# Patient Record
Sex: Female | Born: 1965 | Race: Black or African American | Hispanic: No | Marital: Single
Health system: Southern US, Community
[De-identification: ages and names within clinical notes are randomized; demographics above are authoritative.]

## PROBLEM LIST (undated history)

## (undated) DIAGNOSIS — I10 Essential (primary) hypertension: Secondary | ICD-10-CM

---

## 2001-10-06 ENCOUNTER — Emergency Department (HOSPITAL_COMMUNITY): Admission: EM | Admit: 2001-10-06 | Discharge: 2001-10-06 | Payer: Self-pay | Admitting: Emergency Medicine

## 2001-10-06 ENCOUNTER — Encounter: Payer: Self-pay | Admitting: Emergency Medicine

## 2001-11-10 ENCOUNTER — Encounter: Admission: RE | Admit: 2001-11-10 | Discharge: 2001-11-10 | Payer: Self-pay | Admitting: Internal Medicine

## 2001-11-10 ENCOUNTER — Encounter: Payer: Self-pay | Admitting: Internal Medicine

## 2001-12-12 ENCOUNTER — Emergency Department (HOSPITAL_COMMUNITY): Admission: EM | Admit: 2001-12-12 | Discharge: 2001-12-12 | Payer: Self-pay | Admitting: Emergency Medicine

## 2007-02-27 ENCOUNTER — Emergency Department (HOSPITAL_COMMUNITY): Admission: EM | Admit: 2007-02-27 | Discharge: 2007-02-27 | Payer: Self-pay | Admitting: Emergency Medicine

## 2007-05-01 ENCOUNTER — Ambulatory Visit: Payer: Self-pay | Admitting: Internal Medicine

## 2007-05-03 ENCOUNTER — Ambulatory Visit: Payer: Self-pay | Admitting: *Deleted

## 2007-05-08 ENCOUNTER — Encounter: Payer: Self-pay | Admitting: Family Medicine

## 2007-05-08 ENCOUNTER — Ambulatory Visit: Payer: Self-pay | Admitting: Internal Medicine

## 2007-05-08 LAB — CONVERTED CEMR LAB
Chlamydia, DNA Probe: NEGATIVE
GC Probe Amp, Genital: NEGATIVE

## 2007-05-10 ENCOUNTER — Ambulatory Visit: Payer: Self-pay | Admitting: Internal Medicine

## 2007-05-12 ENCOUNTER — Ambulatory Visit (HOSPITAL_COMMUNITY): Admission: RE | Admit: 2007-05-12 | Discharge: 2007-05-12 | Payer: Self-pay | Admitting: Family Medicine

## 2007-05-24 ENCOUNTER — Ambulatory Visit: Payer: Self-pay | Admitting: Internal Medicine

## 2007-06-14 ENCOUNTER — Encounter (INDEPENDENT_AMBULATORY_CARE_PROVIDER_SITE_OTHER): Payer: Self-pay | Admitting: Family Medicine

## 2007-06-14 ENCOUNTER — Ambulatory Visit: Payer: Self-pay | Admitting: Internal Medicine

## 2007-06-14 LAB — CONVERTED CEMR LAB
ALT: 9 units/L (ref 0–35)
AST: 13 units/L (ref 0–37)
Albumin: 4.1 g/dL (ref 3.5–5.2)
Alkaline Phosphatase: 56 units/L (ref 39–117)
BUN: 10 mg/dL (ref 6–23)
Basophils Absolute: 0 10*3/uL (ref 0.0–0.1)
Basophils Relative: 0 % (ref 0–1)
CO2: 25 meq/L (ref 19–32)
Calcium: 9.2 mg/dL (ref 8.4–10.5)
Chloride: 104 meq/L (ref 96–112)
Cholesterol: 184 mg/dL (ref 0–200)
Creatinine, Ser: 0.96 mg/dL (ref 0.40–1.20)
Eosinophils Absolute: 0.1 10*3/uL (ref 0.0–0.7)
Eosinophils Relative: 1 % (ref 0–5)
Glucose, Bld: 87 mg/dL (ref 70–99)
HCT: 42.4 % (ref 36.0–46.0)
HDL: 39 mg/dL — ABNORMAL LOW (ref >39)
Hemoglobin: 14 g/dL (ref 12.0–15.0)
LDL Cholesterol: 130 mg/dL — ABNORMAL HIGH (ref 0–99)
Lymphocytes Relative: 36 % (ref 12–46)
Lymphs Abs: 2 10*3/uL (ref 0.7–3.3)
MCHC: 33 g/dL (ref 30.0–36.0)
MCV: 94.4 fL (ref 78.0–100.0)
Monocytes Absolute: 0.4 10*3/uL (ref 0.2–0.7)
Monocytes Relative: 7 % (ref 3–11)
Neutro Abs: 3.1 10*3/uL (ref 1.7–7.7)
Neutrophils Relative %: 56 % (ref 43–77)
Platelets: 278 10*3/uL (ref 150–400)
Potassium: 4 meq/L (ref 3.5–5.3)
RBC: 4.49 M/uL (ref 3.87–5.11)
RDW: 14 % (ref 11.5–14.0)
Sodium: 139 meq/L (ref 135–145)
Total Bilirubin: 0.7 mg/dL (ref 0.3–1.2)
Total CHOL/HDL Ratio: 4.7
Total Protein: 7.3 g/dL (ref 6.0–8.3)
Triglycerides: 77 mg/dL (ref <150)
VLDL: 15 mg/dL (ref 0–40)
WBC: 5.5 10*3/uL (ref 4.0–10.5)

## 2020-01-20 ENCOUNTER — Emergency Department (HOSPITAL_COMMUNITY): Payer: No Typology Code available for payment source

## 2020-01-20 ENCOUNTER — Emergency Department (HOSPITAL_COMMUNITY)
Admission: EM | Admit: 2020-01-20 | Discharge: 2020-01-20 | Disposition: A | Discharge status: Home / Self Care | Payer: No Typology Code available for payment source | Attending: Emergency Medicine | Admitting: Emergency Medicine

## 2020-01-20 ENCOUNTER — Other Ambulatory Visit: Payer: Self-pay

## 2020-01-20 ENCOUNTER — Encounter (HOSPITAL_COMMUNITY): Payer: Self-pay | Admitting: Emergency Medicine

## 2020-01-20 DIAGNOSIS — Z1881 Retained glass fragments: Secondary | ICD-10-CM | POA: Diagnosis not present

## 2020-01-20 DIAGNOSIS — S7001XA Contusion of right hip, initial encounter: Secondary | ICD-10-CM | POA: Insufficient documentation

## 2020-01-20 DIAGNOSIS — R519 Headache, unspecified: Secondary | ICD-10-CM | POA: Insufficient documentation

## 2020-01-20 DIAGNOSIS — Y9389 Activity, other specified: Secondary | ICD-10-CM | POA: Diagnosis not present

## 2020-01-20 DIAGNOSIS — Z23 Encounter for immunization: Secondary | ICD-10-CM | POA: Diagnosis not present

## 2020-01-20 DIAGNOSIS — I10 Essential (primary) hypertension: Secondary | ICD-10-CM | POA: Diagnosis not present

## 2020-01-20 DIAGNOSIS — Y92411 Interstate highway as the place of occurrence of the external cause: Secondary | ICD-10-CM | POA: Diagnosis not present

## 2020-01-20 DIAGNOSIS — S41111A Laceration without foreign body of right upper arm, initial encounter: Secondary | ICD-10-CM | POA: Diagnosis not present

## 2020-01-20 DIAGNOSIS — S2221XA Fracture of manubrium, initial encounter for closed fracture: Secondary | ICD-10-CM | POA: Diagnosis not present

## 2020-01-20 DIAGNOSIS — R55 Syncope and collapse: Secondary | ICD-10-CM | POA: Insufficient documentation

## 2020-01-20 DIAGNOSIS — M545 Low back pain: Secondary | ICD-10-CM | POA: Diagnosis not present

## 2020-01-20 DIAGNOSIS — S299XXA Unspecified injury of thorax, initial encounter: Secondary | ICD-10-CM | POA: Diagnosis present

## 2020-01-20 DIAGNOSIS — S51011A Laceration without foreign body of right elbow, initial encounter: Secondary | ICD-10-CM | POA: Diagnosis not present

## 2020-01-20 DIAGNOSIS — R1030 Lower abdominal pain, unspecified: Secondary | ICD-10-CM | POA: Diagnosis not present

## 2020-01-20 DIAGNOSIS — S51821A Laceration with foreign body of right forearm, initial encounter: Secondary | ICD-10-CM | POA: Insufficient documentation

## 2020-01-20 DIAGNOSIS — Y999 Unspecified external cause status: Secondary | ICD-10-CM | POA: Insufficient documentation

## 2020-01-20 LAB — COMPREHENSIVE METABOLIC PANEL
ALT: 32 U/L (ref 0–44)
AST: 35 U/L (ref 15–41)
Albumin: 3.9 g/dL (ref 3.5–5.0)
Alkaline Phosphatase: 69 U/L (ref 38–126)
Anion gap: 12 (ref 5–15)
BUN: 11 mg/dL (ref 6–20)
CO2: 25 mmol/L (ref 22–32)
Calcium: 9.1 mg/dL (ref 8.9–10.3)
Chloride: 102 mmol/L (ref 98–111)
Creatinine, Ser: 0.96 mg/dL (ref 0.44–1.00)
GFR calc Af Amer: >60 mL/min (ref >60)
GFR calc non Af Amer: >60 mL/min (ref >60)
Glucose, Bld: 101 mg/dL — ABNORMAL HIGH (ref 70–99)
Potassium: 4.3 mmol/L (ref 3.5–5.1)
Sodium: 139 mmol/L (ref 135–145)
Total Bilirubin: 0.7 mg/dL (ref 0.3–1.2)
Total Protein: 7.6 g/dL (ref 6.5–8.1)

## 2020-01-20 LAB — BASIC METABOLIC PANEL
Anion gap: 13 (ref 5–15)
BUN: 10 mg/dL (ref 6–20)
CO2: 23 mmol/L (ref 22–32)
Calcium: 8.9 mg/dL (ref 8.9–10.3)
Chloride: 103 mmol/L (ref 98–111)
Creatinine, Ser: 0.95 mg/dL (ref 0.44–1.00)
GFR calc Af Amer: >60 mL/min (ref >60)
GFR calc non Af Amer: >60 mL/min (ref >60)
Glucose, Bld: 95 mg/dL (ref 70–99)
Potassium: 3.7 mmol/L (ref 3.5–5.1)
Sodium: 139 mmol/L (ref 135–145)

## 2020-01-20 LAB — PROTIME-INR
INR: 0.9 (ref 0.8–1.2)
Prothrombin Time: 12.2 seconds (ref 11.4–15.2)

## 2020-01-20 LAB — I-STAT CHEM 8, ED
BUN: 11 mg/dL (ref 6–20)
Calcium, Ion: 1.12 mmol/L — ABNORMAL LOW (ref 1.15–1.40)
Chloride: 105 mmol/L (ref 98–111)
Creatinine, Ser: 0.9 mg/dL (ref 0.44–1.00)
Glucose, Bld: 95 mg/dL (ref 70–99)
HCT: 43 % (ref 36.0–46.0)
Hemoglobin: 14.6 g/dL (ref 12.0–15.0)
Potassium: 4 mmol/L (ref 3.5–5.1)
Sodium: 141 mmol/L (ref 135–145)
TCO2: 25 mmol/L (ref 22–32)

## 2020-01-20 LAB — CBC
HCT: 43 % (ref 36.0–46.0)
Hemoglobin: 14.4 g/dL (ref 12.0–15.0)
MCH: 29.9 pg (ref 26.0–34.0)
MCHC: 33.5 g/dL (ref 30.0–36.0)
MCV: 89.4 fL (ref 80.0–100.0)
Platelets: 244 10*3/uL (ref 150–400)
RBC: 4.81 MIL/uL (ref 3.87–5.11)
RDW: 12.6 % (ref 11.5–15.5)
WBC: 7.4 10*3/uL (ref 4.0–10.5)
nRBC: 0 % (ref 0.0–0.2)

## 2020-01-20 LAB — URINALYSIS, ROUTINE W REFLEX MICROSCOPIC
Bilirubin Urine: NEGATIVE
Glucose, UA: NEGATIVE mg/dL
Hgb urine dipstick: NEGATIVE
Ketones, ur: NEGATIVE mg/dL
Leukocytes,Ua: NEGATIVE
Nitrite: NEGATIVE
Protein, ur: NEGATIVE mg/dL
Specific Gravity, Urine: 1.029 (ref 1.005–1.030)
pH: 6 (ref 5.0–8.0)

## 2020-01-20 LAB — I-STAT BETA HCG BLOOD, ED (MC, WL, AP ONLY): I-stat hCG, quantitative: <5 m[IU]/mL (ref <5)

## 2020-01-20 LAB — LACTIC ACID, PLASMA: Lactic Acid, Venous: 1.8 mmol/L (ref 0.5–1.9)

## 2020-01-20 LAB — SAMPLE TO BLOOD BANK

## 2020-01-20 LAB — ETHANOL: Alcohol, Ethyl (B): <10 mg/dL (ref <10)

## 2020-01-20 MED ORDER — OXYCODONE-ACETAMINOPHEN 5-325 MG PO TABS
1.0000 | ORAL_TABLET | Freq: Once | ORAL | Status: AC
  Administered 2020-01-20: 1 via ORAL
  Filled 2020-01-20: qty 1

## 2020-01-20 MED ORDER — LIDOCAINE-EPINEPHRINE (PF) 2 %-1:200000 IJ SOLN
20.0000 mL | Freq: Once | INTRAMUSCULAR | Status: AC
  Administered 2020-01-20: 20 mL
  Filled 2020-01-20: qty 20

## 2020-01-20 MED ORDER — ONDANSETRON HCL 4 MG/2ML IJ SOLN
4.0000 mg | Freq: Once | INTRAMUSCULAR | Status: AC
  Administered 2020-01-20: 4 mg via INTRAVENOUS
  Filled 2020-01-20: qty 2

## 2020-01-20 MED ORDER — IBUPROFEN 600 MG PO TABS
600.0000 mg | ORAL_TABLET | Freq: Four times a day (QID) | ORAL | 0 refills | Status: AC | PRN
Start: 2020-01-20 — End: ?

## 2020-01-20 MED ORDER — IOHEXOL 300 MG/ML  SOLN
100.0000 mL | Freq: Once | INTRAMUSCULAR | Status: AC | PRN
  Administered 2020-01-20: 100 mL via INTRAVENOUS

## 2020-01-20 MED ORDER — HYDROMORPHONE HCL 1 MG/ML IJ SOLN
1.0000 mg | Freq: Once | INTRAMUSCULAR | Status: AC
  Administered 2020-01-20: 1 mg via INTRAVENOUS
  Filled 2020-01-20: qty 1

## 2020-01-20 MED ORDER — CYCLOBENZAPRINE HCL 10 MG PO TABS
5.0000 mg | ORAL_TABLET | Freq: Once | ORAL | Status: AC
  Administered 2020-01-20: 5 mg via ORAL
  Filled 2020-01-20: qty 1

## 2020-01-20 MED ORDER — CYCLOBENZAPRINE HCL 10 MG PO TABS
10.0000 mg | ORAL_TABLET | Freq: Two times a day (BID) | ORAL | 0 refills | Status: AC | PRN
Start: 2020-01-20 — End: ?

## 2020-01-20 MED ORDER — HYDROCODONE-ACETAMINOPHEN 5-325 MG PO TABS: 2.0000 | ORAL_TABLET | Freq: Four times a day (QID) | ORAL | 0 refills | Status: AC | PRN

## 2020-01-20 MED ORDER — TETANUS-DIPHTH-ACELL PERTUSSIS 5-2.5-18.5 LF-MCG/0.5 IM SUSP
0.5000 mL | Freq: Once | INTRAMUSCULAR | Status: AC
  Administered 2020-01-20: 0.5 mL via INTRAMUSCULAR
  Filled 2020-01-20: qty 0.5
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
## 2020-01-20 NOTE — ED Notes (Signed)
Transported to CT 

## 2020-01-20 NOTE — ED Notes (Signed)
Pt to ct 
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
## 2020-01-20 NOTE — ED Notes (Signed)
Pt ambulated past nurses station and back w/o assistance.

## 2020-01-20 NOTE — ED Notes (Signed)
Pt verbalized understanding of d/c instructions, follow up care and s/s requiring return. Discussed proper use of incentive spirometer. Pt had no further questions at this time and transported via wheelchair to exit.

## 2020-03-10 ENCOUNTER — Emergency Department (HOSPITAL_COMMUNITY): Payer: No Typology Code available for payment source

## 2020-03-10 ENCOUNTER — Encounter (HOSPITAL_COMMUNITY): Payer: Self-pay

## 2020-03-10 ENCOUNTER — Emergency Department (HOSPITAL_COMMUNITY)
Admission: EM | Admit: 2020-03-10 | Discharge: 2020-03-10 | Disposition: A | Discharge status: Home / Self Care | Payer: No Typology Code available for payment source | Attending: Emergency Medicine | Admitting: Emergency Medicine

## 2020-03-10 ENCOUNTER — Other Ambulatory Visit: Payer: Self-pay

## 2020-03-10 DIAGNOSIS — M545 Low back pain: Secondary | ICD-10-CM | POA: Insufficient documentation

## 2020-03-10 DIAGNOSIS — R079 Chest pain, unspecified: Secondary | ICD-10-CM | POA: Insufficient documentation

## 2020-03-10 DIAGNOSIS — Z5321 Procedure and treatment not carried out due to patient leaving prior to being seen by health care provider: Secondary | ICD-10-CM | POA: Insufficient documentation

## 2020-03-10 LAB — CBC
HCT: 41.6 % (ref 36.0–46.0)
Hemoglobin: 14.1 g/dL (ref 12.0–15.0)
MCH: 30 pg (ref 26.0–34.0)
MCHC: 33.9 g/dL (ref 30.0–36.0)
MCV: 88.5 fL (ref 80.0–100.0)
Platelets: 280 10*3/uL (ref 150–400)
RBC: 4.7 MIL/uL (ref 3.87–5.11)
RDW: 12.8 % (ref 11.5–15.5)
WBC: 4.2 10*3/uL (ref 4.0–10.5)
nRBC: 0 % (ref 0.0–0.2)

## 2020-03-10 LAB — I-STAT BETA HCG BLOOD, ED (MC, WL, AP ONLY): I-stat hCG, quantitative: <5 m[IU]/mL (ref <5)

## 2020-03-10 LAB — BASIC METABOLIC PANEL
Anion gap: 8 (ref 5–15)
BUN: 5 mg/dL — ABNORMAL LOW (ref 6–20)
CO2: 28 mmol/L (ref 22–32)
Calcium: 9.2 mg/dL (ref 8.9–10.3)
Chloride: 103 mmol/L (ref 98–111)
Creatinine, Ser: 1.03 mg/dL — ABNORMAL HIGH (ref 0.44–1.00)
GFR calc Af Amer: >60 mL/min (ref >60)
GFR calc non Af Amer: >60 mL/min (ref >60)
Glucose, Bld: 96 mg/dL (ref 70–99)
Potassium: 3.1 mmol/L — ABNORMAL LOW (ref 3.5–5.1)
Sodium: 139 mmol/L (ref 135–145)

## 2020-03-10 LAB — TROPONIN I (HIGH SENSITIVITY): Troponin I (High Sensitivity): 4 ng/L (ref <18)

## 2020-03-10 MED ORDER — SODIUM CHLORIDE 0.9% FLUSH: 3.0000 mL | Freq: Once | INTRAVENOUS | Status: DC

## 2020-03-10 NOTE — ED Notes (Signed)
Informed by Registration that pt was leaving. Pt will be moved OTF.
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
## 2020-03-11 ENCOUNTER — Encounter (HOSPITAL_COMMUNITY): Payer: Self-pay | Admitting: Emergency Medicine

## 2020-03-11 ENCOUNTER — Emergency Department (HOSPITAL_COMMUNITY)
Admission: EM | Admit: 2020-03-11 | Discharge: 2020-03-11 | Disposition: A | Discharge status: Home / Self Care | Payer: Self-pay | Attending: Emergency Medicine | Admitting: Emergency Medicine

## 2020-03-11 DIAGNOSIS — I10 Essential (primary) hypertension: Secondary | ICD-10-CM | POA: Diagnosis not present

## 2020-03-11 DIAGNOSIS — R079 Chest pain, unspecified: Secondary | ICD-10-CM | POA: Insufficient documentation

## 2020-03-11 HISTORY — DX: Essential (primary) hypertension: I10

## 2020-03-11 MED ORDER — TRAMADOL HCL 50 MG PO TABS: 50.0000 mg | ORAL_TABLET | Freq: Four times a day (QID) | ORAL | 0 refills | Status: AC | PRN
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
## 2020-03-11 NOTE — ED Notes (Signed)
Pt given dc instructions pt verbalizes understanding.  

## 2020-03-11 NOTE — Discharge Instructions (Signed)
Please return for any problem.  Follow-up primary care provider as instructed.
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
